# Patient Record
Sex: Female | Born: 1995 | Race: White | Hispanic: No | Marital: Single | Smoking: Never smoker
Health system: Southern US, Community
[De-identification: ages and names within clinical notes are randomized; demographics above are authoritative.]

## PROBLEM LIST (undated history)

## (undated) ENCOUNTER — Emergency Department (HOSPITAL_BASED_OUTPATIENT_CLINIC_OR_DEPARTMENT_OTHER): Payer: Self-pay | Source: Home / Self Care

## (undated) DIAGNOSIS — S0230XA Fracture of orbital floor, unspecified side, initial encounter for closed fracture: Secondary | ICD-10-CM

## (undated) DIAGNOSIS — R05 Cough: Secondary | ICD-10-CM

## (undated) DIAGNOSIS — Z8489 Family history of other specified conditions: Secondary | ICD-10-CM

## (undated) DIAGNOSIS — Z87820 Personal history of traumatic brain injury: Secondary | ICD-10-CM

## (undated) HISTORY — PX: ORIF FINGER FRACTURE: SHX2122

---
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
